# Patient Record
Sex: Male | Born: 1985 | Race: Black or African American | Hispanic: No | Marital: Single | Smoking: Current some day smoker
Health system: Southern US, Community
[De-identification: ages and names within clinical notes are randomized; demographics above are authoritative.]

## PROBLEM LIST (undated history)

## (undated) DIAGNOSIS — Z21 Asymptomatic human immunodeficiency virus [HIV] infection status: Secondary | ICD-10-CM

## (undated) DIAGNOSIS — B20 Human immunodeficiency virus [HIV] disease: Secondary | ICD-10-CM

---

## 2011-10-27 ENCOUNTER — Encounter (HOSPITAL_COMMUNITY): Payer: Self-pay | Admitting: Emergency Medicine

## 2011-10-27 ENCOUNTER — Emergency Department (HOSPITAL_COMMUNITY): Payer: Self-pay

## 2011-10-27 ENCOUNTER — Emergency Department (HOSPITAL_COMMUNITY)
Admission: EM | Admit: 2011-10-27 | Discharge: 2011-10-27 | Disposition: A | Discharge status: Home / Self Care | Payer: Self-pay | Attending: Emergency Medicine | Admitting: Emergency Medicine

## 2011-10-27 DIAGNOSIS — R05 Cough: Secondary | ICD-10-CM | POA: Insufficient documentation

## 2011-10-27 DIAGNOSIS — R059 Cough, unspecified: Secondary | ICD-10-CM | POA: Insufficient documentation

## 2011-10-27 DIAGNOSIS — R0789 Other chest pain: Secondary | ICD-10-CM | POA: Insufficient documentation

## 2011-10-27 DIAGNOSIS — J209 Acute bronchitis, unspecified: Secondary | ICD-10-CM

## 2011-10-27 DIAGNOSIS — R509 Fever, unspecified: Secondary | ICD-10-CM | POA: Insufficient documentation

## 2011-10-27 MED ORDER — BENZONATATE 100 MG PO CAPS: 200.0000 mg | ORAL_CAPSULE | Freq: Two times a day (BID) | ORAL | Status: AC | PRN

## 2011-10-27 MED ORDER — GUAIFENESIN 100 MG/5ML PO LIQD: 100.0000 mg | ORAL | Status: AC | PRN

## 2011-10-27 MED ORDER — OXYCODONE-ACETAMINOPHEN 5-325 MG PO TABS
2.0000 | ORAL_TABLET | Freq: Once | ORAL | Status: AC
  Administered 2011-10-27: 2 via ORAL
  Filled 2011-10-27: qty 2

## 2011-10-27 MED ORDER — IBUPROFEN 800 MG PO TABS: 800.0000 mg | ORAL_TABLET | Freq: Three times a day (TID) | ORAL | Status: AC

## 2011-10-27 MED ORDER — ALBUTEROL SULFATE HFA 108 (90 BASE) MCG/ACT IN AERS: 1.0000 | INHALATION_SPRAY | Freq: Four times a day (QID) | RESPIRATORY_TRACT | Status: DC | PRN

## 2011-10-27 NOTE — ED Notes (Signed)
ZOX:[id number]<AV> Expected date:10/27/11<BR> Expected time: 4:13 AM<BR> Means of arrival:Ambulance<BR> Comments:<BR> RM 23: Fever, chest pain w/ cough

## 2011-10-27 NOTE — ED Notes (Signed)
Patient transported to X-ray 

## 2011-10-27 NOTE — ED Notes (Signed)
Pt alert, arives from home, c/o cough, fever, chest pain, onset was this evening, URI s/s two weeks ago, resp even unlabored, skin pwd

## 2011-10-27 NOTE — ED Provider Notes (Signed)
Medical screening examination/treatment/procedure(s) were performed by non-physician practitioner and as supervising physician I was immediately available for consultation/collaboration.  Cheri Guppy, MD 10/27/11 [phone]

## 2011-10-27 NOTE — ED Provider Notes (Signed)
History     CSN: [id number]  Arrival date & time 10/27/11  4098   First MD Initiated Contact with Patient 10/27/11 0550      Chief Complaint  Patient presents with  . Cough  . Fever    (Consider location/radiation/quality/duration/timing/severity/associated sxs/prior treatment) HPI Comments: Joseph Mcfarland 26 y.o. male   The chief complaint is: Patient presents with:   Cough   Fever   The patient has medical history significant for:   History reviewed. No pertinent past medical history.  Patient presents with a three week history of non-productive cough, subjective fever, chills, night sweats, rhinorrhea, and pleuritic pain rated 5-6/10. He has tried OTC tylenol and mucinex without improvement of symptoms. Denies SOB. Denies sore throat. Reports some nausea but denies vomiting or diarrhea. Sick contacts include a cousin who is also his roommate.      Patient is a 26 y.o. male presenting with fever. The history is provided by the patient.  Fever Primary symptoms of the febrile illness include fever, cough and nausea. Primary symptoms do not include shortness of breath, abdominal pain, vomiting or diarrhea.    History reviewed. No pertinent past medical history.  History reviewed. No pertinent past surgical history.  No family history on file.  History  Substance Use Topics  . Smoking status: Not on file  . Smokeless tobacco: Not on file  . Alcohol Use: Not on file      Review of Systems  Constitutional: Positive for fever, chills and diaphoresis.  HENT: Positive for rhinorrhea. Negative for congestion and sore throat.   Respiratory: Positive for cough and chest tightness. Negative for shortness of breath.   Gastrointestinal: Positive for nausea. Negative for vomiting, abdominal pain and diarrhea.  All other systems reviewed and are negative.    Allergies  Review of patient's allergies indicates no known allergies.  Home Medications  No current  outpatient prescriptions on file.  BP 115/78  Pulse 90  Temp 98 F (36.7 C) (Oral)  Resp 18  SpO2 98%  Physical Exam  Nursing note and vitals reviewed. Constitutional: He appears well-developed and well-nourished. He appears distressed.  HENT:  Head: Normocephalic and atraumatic.  Mouth/Throat: Oropharynx is clear and moist. No oropharyngeal exudate.  Eyes: Conjunctivae and EOM are normal. No scleral icterus.  Neck: Normal range of motion. Neck supple.  Cardiovascular: Normal rate, regular rhythm and normal heart sounds.   Pulmonary/Chest: Effort normal and breath sounds normal.  Abdominal: Soft. Bowel sounds are normal. There is no tenderness.  Lymphadenopathy:    He has no cervical adenopathy.  Neurological: He is alert.  Skin: Skin is warm and dry.    ED Course  Procedures (including critical care time)  Labs Reviewed - No data to display No results found.   1. Acute bronchitis       MDM  Patient presented with 3 weeks of cough, subjective fevers, chills, nigh sweats, and pleuritic CP 5-6/10. Patient given pain medication in ED. CXR: remarkable for bronchitis. Patient discharged on inhaler, ibuprofen, and cough suppressants. Return precautions given verbally and in discharge instructions. No red flags for pneumonia, ARDS, or pneumothorax.        Pixie Casino, PA-C 10/27/11 [phone]

## 2012-03-08 ENCOUNTER — Telehealth: Payer: Self-pay

## 2012-03-08 NOTE — Telephone Encounter (Signed)
Left message for patient to call for Intake Appointment.  Referral from [facility].  Tomasita Morrow, RN

## 2012-03-23 ENCOUNTER — Ambulatory Visit: Payer: Self-pay

## 2012-04-02 ENCOUNTER — Telehealth: Payer: Self-pay

## 2012-04-02 NOTE — Telephone Encounter (Signed)
Joseph Mcfarland , [facility] Counselor was able to contact patient. He is ready to schedule appointment but needs it before 10:00 am.  Appointment info left on his voicemail per patient request.   Pt advised to call the office if he is not able to keep appointment.   [name], RN

## 2012-04-20 ENCOUNTER — Ambulatory Visit: Payer: Self-pay

## 2012-07-22 NOTE — Progress Notes (Signed)
Pt has been referred to [facility] due to missed intake appointments and no return calls.   [name], RN

## 2013-03-25 ENCOUNTER — Emergency Department (HOSPITAL_COMMUNITY)
Admission: EM | Admit: 2013-03-25 | Discharge: 2013-03-25 | Disposition: A | Discharge status: Home / Self Care | Payer: Self-pay | Attending: Emergency Medicine | Admitting: Emergency Medicine

## 2013-03-25 ENCOUNTER — Emergency Department (HOSPITAL_COMMUNITY): Payer: Self-pay

## 2013-03-25 ENCOUNTER — Encounter (HOSPITAL_COMMUNITY): Payer: Self-pay | Admitting: Emergency Medicine

## 2013-03-25 DIAGNOSIS — R519 Headache, unspecified: Secondary | ICD-10-CM

## 2013-03-25 DIAGNOSIS — Z21 Asymptomatic human immunodeficiency virus [HIV] infection status: Secondary | ICD-10-CM | POA: Insufficient documentation

## 2013-03-25 DIAGNOSIS — R51 Headache: Secondary | ICD-10-CM | POA: Insufficient documentation

## 2013-03-25 DIAGNOSIS — R000 Tachycardia, unspecified: Secondary | ICD-10-CM | POA: Insufficient documentation

## 2013-03-25 DIAGNOSIS — F17200 Nicotine dependence, unspecified, uncomplicated: Secondary | ICD-10-CM | POA: Insufficient documentation

## 2013-03-25 HISTORY — DX: Human immunodeficiency virus (HIV) disease: B20

## 2013-03-25 HISTORY — DX: Asymptomatic human immunodeficiency virus (hiv) infection status: Z21

## 2013-03-25 LAB — T-HELPER CELLS (CD4) COUNT (NOT AT ARMC)
CD4 T CELL ABS: 30 /uL — AB (ref 400–2700)
CD4 T CELL HELPER: 2 % — AB (ref 33–55)

## 2013-03-25 LAB — CBC WITH DIFFERENTIAL/PLATELET
BASOS ABS: 0 10*3/uL (ref 0.0–0.1)
Basophils Relative: 0 % (ref 0–1)
EOS ABS: 0.2 10*3/uL (ref 0.0–0.7)
EOS PCT: 3 % (ref 0–5)
HCT: 37.5 % — ABNORMAL LOW (ref 39.0–52.0)
Hemoglobin: 13.1 g/dL (ref 13.0–17.0)
LYMPHS ABS: 1.5 10*3/uL (ref 0.7–4.0)
Lymphocytes Relative: 24 % (ref 12–46)
MCH: 29.8 pg (ref 26.0–34.0)
MCHC: 34.9 g/dL (ref 30.0–36.0)
MCV: 85.4 fL (ref 78.0–100.0)
Monocytes Absolute: 0.7 10*3/uL (ref 0.1–1.0)
Monocytes Relative: 11 % (ref 3–12)
NEUTROS PCT: 62 % (ref 43–77)
Neutro Abs: 3.8 10*3/uL (ref 1.7–7.7)
PLATELETS: 297 10*3/uL (ref 150–400)
RBC: 4.39 MIL/uL (ref 4.22–5.81)
RDW: 12.6 % (ref 11.5–15.5)
WBC: 6.2 10*3/uL (ref 4.0–10.5)

## 2013-03-25 MED ORDER — KETOROLAC TROMETHAMINE 30 MG/ML IJ SOLN
30.0000 mg | Freq: Once | INTRAMUSCULAR | Status: AC
  Administered 2013-03-25: 30 mg via INTRAVENOUS
  Filled 2013-03-25: qty 1

## 2013-03-25 MED ORDER — METOCLOPRAMIDE HCL 10 MG PO TABS: 10.0000 mg | ORAL_TABLET | Freq: Three times a day (TID) | ORAL | Status: DC | PRN

## 2013-03-25 MED ORDER — SODIUM CHLORIDE 0.9 % IV BOLUS (SEPSIS)
1000.0000 mL | Freq: Once | INTRAVENOUS | Status: AC
  Administered 2013-03-25: 1000 mL via INTRAVENOUS

## 2013-03-25 MED ORDER — METOCLOPRAMIDE HCL 5 MG/ML IJ SOLN
10.0000 mg | Freq: Once | INTRAMUSCULAR | Status: AC
  Administered 2013-03-25: 10 mg via INTRAVENOUS
  Filled 2013-03-25: qty 2

## 2013-03-25 MED ORDER — NAPROXEN 500 MG PO TABS: 500.0000 mg | ORAL_TABLET | Freq: Two times a day (BID) | ORAL | Status: DC
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
## 2013-03-25 NOTE — Discharge Instructions (Signed)
Please call your doctor for a followup appointment within 24-48 hours. When you talk to your doctor please let them know that you were seen in the emergency department and have them acquire all of your records so that they can discuss the findings with you and formulate a treatment plan to fully care for your new and ongoing problems.  I would encourage you to follow up closely with the infectious disease center here at [facility] for further counseling and treatment of your HIV disease

## 2013-03-25 NOTE — ED Notes (Signed)
Patient reports experiencing migraine symptoms. Denies n/v, but has some photophobia. Patient was recently diagnosed with HIV last month and states his migraines have gotten worse since being diagnosed. Patient also reports having neck tenderness. Patient is a/o x4, NAD.

## 2013-04-12 ENCOUNTER — Ambulatory Visit: Payer: Self-pay

## 2013-04-14 ENCOUNTER — Emergency Department (HOSPITAL_COMMUNITY)
Admission: EM | Admit: 2013-04-14 | Discharge: 2013-04-14 | Disposition: A | Discharge status: Home / Self Care | Payer: Self-pay | Attending: Emergency Medicine | Admitting: Emergency Medicine

## 2013-04-14 ENCOUNTER — Encounter (HOSPITAL_COMMUNITY): Payer: Self-pay | Admitting: Emergency Medicine

## 2013-04-14 DIAGNOSIS — R21 Rash and other nonspecific skin eruption: Secondary | ICD-10-CM | POA: Insufficient documentation

## 2013-04-14 DIAGNOSIS — F17200 Nicotine dependence, unspecified, uncomplicated: Secondary | ICD-10-CM | POA: Insufficient documentation

## 2013-04-14 DIAGNOSIS — Z21 Asymptomatic human immunodeficiency virus [HIV] infection status: Secondary | ICD-10-CM | POA: Insufficient documentation

## 2013-04-14 MED ORDER — DIPHENHYDRAMINE HCL 25 MG PO CAPS
50.0000 mg | ORAL_CAPSULE | Freq: Once | ORAL | Status: AC
  Administered 2013-04-14: 50 mg via ORAL
  Filled 2013-04-14: qty 2

## 2013-04-14 MED ORDER — DIPHENHYDRAMINE HCL 25 MG PO TABS: 25.0000 mg | ORAL_TABLET | Freq: Four times a day (QID) | ORAL | Status: AC | PRN

## 2013-04-14 MED ORDER — DEXAMETHASONE SODIUM PHOSPHATE 10 MG/ML IJ SOLN
10.0000 mg | Freq: Once | INTRAMUSCULAR | Status: AC
  Administered 2013-04-14: 10 mg via INTRAMUSCULAR
  Filled 2013-04-14: qty 1

## 2013-04-14 NOTE — ED Notes (Signed)
Pt dc to home. Pt sts understanding to dc instructions. Pt ambulatory to exit without difficulty.  Pt denies need for w/c.  

## 2013-04-14 NOTE — ED Provider Notes (Signed)
CSN: [id number]     Arrival date & time 04/14/13  1523 History  This chart was scribed for non-physician practitioner Felipa Furnace working with Gavin Pound. Oletta Lamas, MD by Joaquin Music, ED Scribe. This patient was seen in room TR09C/TR09C and the patient's care was started at 4:21 PM .   Chief Complaint  Patient presents with  . Rash   The history is provided by the patient. No language interpreter was used.   HPI Comments: Joseph Mcfarland is a 28 y.o. male who presents to the Emergency Department complaining of entire body rash with associated itching that began 1 hour ago. Pt denies coming in contact with anything recently. He states he took a shower and went to a local grocery store and reports having the rash begin suddenly. Pt denies having pain. Pt denies hx of prior rash.  Pt denies taking any new medications recently, eating or drinking any new foods today, using new clothes, and change of detergents. Pt denies taking Benadryl PTA. Pt denies fever, SOB, chest tightness, wheezing, and facial swelling.   Past Medical History  Diagnosis Date  . HIV (human immunodeficiency virus infection)    History reviewed. No pertinent past surgical history. History reviewed. No pertinent family history. History  Substance Use Topics  . Smoking status: Current Some Day Smoker    Types: Cigarettes  . Smokeless tobacco: Never Used  . Alcohol Use: Yes     Comment: Socially    Review of Systems  Constitutional: Negative for fever.  HENT: Negative for facial swelling.   Respiratory: Negative for chest tightness, shortness of breath and wheezing.   Skin: Positive for rash.   Allergies  Review of patient's allergies indicates no known allergies.  Home Medications  No current outpatient prescriptions on file.  BP 133/90  Pulse 93  Temp(Src) 97.4 F (36.3 C) (Oral)  Resp 18  SpO2 97%  Physical Exam  Nursing note and vitals reviewed. Constitutional: He is oriented to  person, place, and time. He appears well-developed and well-nourished. No distress.  HENT:  Head: Normocephalic and atraumatic.  No angio edema. No K[place] spots.  Eyes: EOM are normal.  Neck: Neck supple. No tracheal deviation present.  Cardiovascular: Normal rate.   Pulmonary/Chest: Effort normal. No respiratory distress.  No SOB, no stridor, normal respirations. No respitory distress.   Musculoskeletal: Normal range of motion.  Neurological: He is alert and oriented to person, place, and time.  Skin: Skin is warm and dry. Rash noted.  Diffuse macular papular rash, no evidence of cellulitis or abscess.  Psychiatric: He has a normal mood and affect. His behavior is normal.    ED Course  Procedures DIAGNOSTIC STUDIES: Oxygen Saturation is 97% on RA, normal by my interpretation.    COORDINATION OF CARE: 4:23 PM-Discussed treatment plan which includes advised pt to F/U with dermatologist if symptoms do not improve. Will administer Benadryl while in ED and shot of Decadron. Will observe pt for 30 mins in ED. Advised pt to return to the ED if high fever, airway constricts, or any worsening symptoms present. Pt agreed to plan.   4:55 PM-Evaluated breathing and rash condition of patient. Rash has improved. Will continue to monitor patient for 10 more mins. Pt agreed to tx.  5:12 PM- Re-evaluated rash and breathing conditions of patient. Rash has improved. Pt no longer complains of itching. Will discharge pt with Benadryl. Pt agreed to plan.  Labs Review Labs Reviewed - No data to display Imaging Review No results  found.  EKG Interpretation   None      MDM   Final diagnoses:  Rash    Patient with rash. It started suddenly today. He does not recall coming in contact with anything, or eating anything. Denies any new detergents or soaps. Patient was recently diagnosed with HIV, he is not receiving treatment yet. He is still working on establishing care. Nothing about the rash seems  infectious. He came on suddenly about an hour or 2 ago, and is itchy. It is improved dramatically with Benadryl. He states that he feels much better. No fevers, no neck stiffness. Rashes on the body, but spares the palms and soles.  Will discharge patient home with infectious disease followup. Continue Benadryl. Return precautions have been given. Patient understands and agrees with the plan. He is stable and ready for discharge. I discussed this patient with Dr. Oletta LamasGhim, who agrees with the plan.  I personally performed the services described in this documentation, which was scribed in my presence. The recorded information has been reviewed and is accurate.     Roxy Horsemanobert Audine Mangione, PA-C 04/14/13 1717

## 2013-04-14 NOTE — ED Notes (Signed)
Reports onset of rash and itching to entire body x 1 hour, denies new meds or products. Has not taken any benadryl pta. Airway intact.

## 2013-04-14 NOTE — Discharge Instructions (Signed)

## 2013-04-19 ENCOUNTER — Ambulatory Visit (INDEPENDENT_AMBULATORY_CARE_PROVIDER_SITE_OTHER): Payer: Self-pay

## 2013-04-19 DIAGNOSIS — Z113 Encounter for screening for infections with a predominantly sexual mode of transmission: Secondary | ICD-10-CM

## 2013-04-19 DIAGNOSIS — B20 Human immunodeficiency virus [HIV] disease: Secondary | ICD-10-CM

## 2013-04-19 DIAGNOSIS — Z23 Encounter for immunization: Secondary | ICD-10-CM

## 2013-04-19 DIAGNOSIS — Z79899 Other long term (current) drug therapy: Secondary | ICD-10-CM

## 2013-04-19 LAB — CBC WITH DIFFERENTIAL/PLATELET
BASOS ABS: 0 10*3/uL (ref 0.0–0.1)
BASOS PCT: 0 % (ref 0–1)
EOS ABS: 0.2 10*3/uL (ref 0.0–0.7)
Eosinophils Relative: 8 % — ABNORMAL HIGH (ref 0–5)
HCT: 40.4 % (ref 39.0–52.0)
HEMOGLOBIN: 13.8 g/dL (ref 13.0–17.0)
Lymphocytes Relative: 39 % (ref 12–46)
Lymphs Abs: 0.8 10*3/uL (ref 0.7–4.0)
MCH: 28.9 pg (ref 26.0–34.0)
MCHC: 34.2 g/dL (ref 30.0–36.0)
MCV: 84.5 fL (ref 78.0–100.0)
MONOS PCT: 10 % (ref 3–12)
Monocytes Absolute: 0.2 10*3/uL (ref 0.1–1.0)
NEUTROS ABS: 0.9 10*3/uL — AB (ref 1.7–7.7)
NEUTROS PCT: 43 % (ref 43–77)
PLATELETS: 409 10*3/uL — AB (ref 150–400)
RBC: 4.78 MIL/uL (ref 4.22–5.81)
RDW: 13.8 % (ref 11.5–15.5)
WBC: 2 10*3/uL — ABNORMAL LOW (ref 4.0–10.5)

## 2013-04-19 LAB — COMPLETE METABOLIC PANEL WITH GFR
ALBUMIN: 4.2 g/dL (ref 3.5–5.2)
ALK PHOS: 46 U/L (ref 39–117)
ALT: 12 U/L (ref 0–53)
AST: 24 U/L (ref 0–37)
BILIRUBIN TOTAL: 0.3 mg/dL (ref 0.2–1.2)
BUN: 7 mg/dL (ref 6–23)
CO2: 29 mEq/L (ref 19–32)
Calcium: 9.6 mg/dL (ref 8.4–10.5)
Chloride: 102 mEq/L (ref 96–112)
Creat: 0.92 mg/dL (ref 0.50–1.35)
GFR, Est African American: >89 mL/min
GLUCOSE: 83 mg/dL (ref 70–99)
POTASSIUM: 4.4 meq/L (ref 3.5–5.3)
SODIUM: 139 meq/L (ref 135–145)
TOTAL PROTEIN: 7.3 g/dL (ref 6.0–8.3)

## 2013-04-19 LAB — LIPID PANEL
CHOL/HDL RATIO: 3.1 ratio
Cholesterol: 122 mg/dL (ref 0–200)
HDL: 40 mg/dL (ref >39)
LDL Cholesterol: 57 mg/dL (ref 0–99)
Triglycerides: 127 mg/dL (ref <150)
VLDL: 25 mg/dL (ref 0–40)

## 2013-04-19 LAB — HEPATITIS C ANTIBODY: HCV AB: NEGATIVE

## 2013-04-19 LAB — RPR

## 2013-04-19 LAB — HEPATITIS B CORE ANTIBODY, TOTAL: HEP B C TOTAL AB: NONREACTIVE

## 2013-04-19 LAB — HEPATITIS B SURFACE ANTIBODY,QUALITATIVE: HEP B S AB: POSITIVE — AB

## 2013-04-19 LAB — HEPATITIS A ANTIBODY, TOTAL: Hep A Total Ab: NONREACTIVE

## 2013-04-19 LAB — HEPATITIS B SURFACE ANTIGEN: Hepatitis B Surface Ag: NEGATIVE

## 2013-04-20 LAB — URINALYSIS
BILIRUBIN URINE: NEGATIVE
GLUCOSE, UA: NEGATIVE mg/dL
HGB URINE DIPSTICK: NEGATIVE
Ketones, ur: NEGATIVE mg/dL
LEUKOCYTES UA: NEGATIVE
Nitrite: NEGATIVE
PROTEIN: NEGATIVE mg/dL
Specific Gravity, Urine: 1.016 (ref 1.005–1.030)
UROBILINOGEN UA: 1 mg/dL (ref 0.0–1.0)
pH: 8 (ref 5.0–8.0)

## 2013-04-20 LAB — T-HELPER CELL (CD4) - (RCID CLINIC ONLY)
CD4 % Helper T Cell: 2 % — ABNORMAL LOW (ref 33–55)
CD4 T Cell Abs: 20 /uL — ABNORMAL LOW (ref 400–2700)

## 2013-04-20 LAB — HIV-1 RNA ULTRAQUANT REFLEX TO GENTYP+
HIV 1 RNA QUANT: 224934 {copies}/mL — AB (ref <20)
HIV-1 RNA QUANT, LOG: 5.35 {Log} — AB (ref <1.30)

## 2013-04-20 LAB — URINE CYTOLOGY ANCILLARY ONLY
CHLAMYDIA, DNA PROBE: NEGATIVE
Neisseria Gonorrhea: NEGATIVE

## 2013-04-22 NOTE — ED Provider Notes (Signed)
Medical screening examination/treatment/procedure(s) were performed by non-physician practitioner and as supervising physician I was immediately available for consultation/collaboration.  EKG Interpretation  None    Dayanis Bergquist Y. Seung Nidiffer, MD 04/22/13 1033 

## 2013-04-28 NOTE — Progress Notes (Signed)
[facility] December [facility], patient states he was not ready to deal with his diagnosis. He was contacted by the state health department and an appointment was made at that time. Patient states that he has the support of his Mother and siblings, he also has a friend that is positive. He admits to having thrush and a skin rash both being treated with fluconazole and clindamycin that were given to him at Urgent Care. He also states that he has noticed weight loss, and night sweats over the past few months. Vaccines given and updated. No records received.

## 2013-04-30 LAB — HIV-1 GENOTYPR PLUS

## 2013-05-03 ENCOUNTER — Ambulatory Visit: Payer: Self-pay | Admitting: Infectious Disease

## 2013-05-04 ENCOUNTER — Encounter: Payer: Self-pay | Admitting: Infectious Disease

## 2013-05-04 ENCOUNTER — Ambulatory Visit (INDEPENDENT_AMBULATORY_CARE_PROVIDER_SITE_OTHER): Payer: Self-pay | Admitting: Infectious Disease

## 2013-05-04 ENCOUNTER — Encounter: Payer: Self-pay | Admitting: *Deleted

## 2013-05-04 VITALS — BP 128/88 | HR 93 | Temp 98.8°F | Ht 68.0 in | Wt 145.0 lb

## 2013-05-04 DIAGNOSIS — B20 Human immunodeficiency virus [HIV] disease: Secondary | ICD-10-CM | POA: Insufficient documentation

## 2013-05-04 DIAGNOSIS — B370 Candidal stomatitis: Secondary | ICD-10-CM

## 2013-05-04 DIAGNOSIS — F17200 Nicotine dependence, unspecified, uncomplicated: Secondary | ICD-10-CM

## 2013-05-04 MED ORDER — SULFAMETHOXAZOLE-TMP DS 800-160 MG PO TABS: 1.0000 | ORAL_TABLET | Freq: Every day | ORAL | Status: DC

## 2013-05-04 MED ORDER — FLUCONAZOLE 100 MG PO TABS: 100.0000 mg | ORAL_TABLET | Freq: Every day | ORAL | Status: DC

## 2013-05-04 MED ORDER — AZITHROMYCIN 600 MG PO TABS: 1200.0000 mg | ORAL_TABLET | ORAL | Status: DC

## 2013-05-04 MED ORDER — AZITHROMYCIN 600 MG PO TABS: 1200.0000 mg | ORAL_TABLET | ORAL | Status: AC

## 2013-05-04 MED ORDER — ELVITEG-COBIC-EMTRICIT-TENOFDF 150-150-200-300 MG PO TABS: 1.0000 | ORAL_TABLET | Freq: Every day | ORAL | Status: DC

## 2013-05-04 NOTE — Progress Notes (Signed)
   Subjective:    Patient ID: Joseph Mcfarland, male    DOB: [birth year], 28 y.o.   MRN: [id number]  HPI  28 year old with HIV/AIDS, apparently diagnosed in 2013 but he did not seek treatment after having been tested in [place]. He moved back to [place]. His intake labs show a CD4 of 20, and a VL >200K  He has apparent wildtype virus (though our genotype dose not check HIV integrase mutations).  AFter extensive discussions we decided to start STRIBILD once his ADAP is processed but in the interim I gave him a bottle of Atripla QHS    Review of Systems  Constitutional: Negative for fever, chills, diaphoresis, activity change, appetite change, fatigue and unexpected weight change.  HENT: Negative for congestion, rhinorrhea, sinus pressure, sneezing, sore throat and trouble swallowing.   Eyes: Negative for photophobia and visual disturbance.  Respiratory: Negative for cough, chest tightness, shortness of breath, wheezing and stridor.   Cardiovascular: Negative for chest pain, palpitations and leg swelling.  Gastrointestinal: Negative for nausea, vomiting, abdominal pain, diarrhea, constipation, blood in stool, abdominal distention and anal bleeding.  Genitourinary: Negative for dysuria, hematuria, flank pain and difficulty urinating.  Musculoskeletal: Negative for arthralgias, back pain, gait problem, joint swelling and myalgias.  Skin: Negative for color change, pallor, rash and wound.  Neurological: Negative for dizziness, tremors, weakness and light-headedness.  Hematological: Negative for adenopathy. Does not bruise/bleed easily.  Psychiatric/Behavioral: Negative for behavioral problems, confusion, sleep disturbance, dysphoric mood, decreased concentration and agitation.       Objective:   Physical Exam  Constitutional: He is oriented to person, place, and time. He appears well-developed and well-nourished. No distress.  HENT:  Head: Normocephalic and atraumatic.  Mouth/Throat: Oropharynx is  clear and moist. No oropharyngeal exudate.    Eyes: Conjunctivae and EOM are normal. No scleral icterus.  Neck: Normal range of motion. Neck supple. No JVD present.  Cardiovascular: Normal rate, regular rhythm and normal heart sounds.  Exam reveals no gallop and no friction rub.   No murmur heard. Pulmonary/Chest: Effort normal and breath sounds normal. No respiratory distress. He has no wheezes. He has no rales. He exhibits no tenderness.  Abdominal: He exhibits no distension and no mass. There is no tenderness. There is no rebound and no guarding.  Musculoskeletal: He exhibits no edema and no tenderness.  Lymphadenopathy:    He has no cervical adenopathy.  Neurological: He is alert and oriented to person, place, and time. He has normal reflexes. He exhibits normal muscle tone. Coordination normal.  Skin: Skin is warm and dry. He is not diaphoretic. No erythema. No pallor.  Psychiatric: He has a normal mood and affect. His behavior is normal. Judgment and thought content normal.          Assessment & Plan:   HIV/AIDS: start ATripla with sample bottle UNTIL his ADAP kicks in and then switch to [facility]. I will also check his HLA B701 at next lab test and we can consider change to [facility] or COMPLERA if tolerability a problem.  Start Bactrim daily  Start Azithromycin 1237m weekly  I spent greater than 60 minutes with the patient including greater than 50% of time in face to face counsel of the patient and in coordination of their care.  Thrush: fluconazole 1068mx 14 days  Smoking: counselled on smoking cessation

## 2013-05-04 NOTE — Patient Instructions (Signed)
UNTIL we get ADAP approval  Take Atripla every night at bedtime on empty stomach  Take bactrim DS white pill to prevent fungal (PCP) PNeumonia  Take TWO tablets every week = azithromycin 1200 mg to prevent MAC infection  I also wrote for some fluconazole 100mg  daily for 2 weeks to treat thrush  WHEN ADAP kicks in you can replace the Atripla with STRIBILD which needs tob e TAKEN WITH FOOD

## 2013-05-04 NOTE — Progress Notes (Signed)
  [facility] - Pharmacist    HPI: Joseph Mcfarland is a 28 y.o. male who presents today to start therapy for his HIV infection which was diagnosed in December 2013. His immune system is significantly weakened with a CD4 count of 20, and his HIV viral load is 224,934.  Allergies: No Known Allergies  Vitals: Temp: 98.8 F (37.1 C) (03/11 1516) Temp src: Oral (03/11 1516) BP: 128/88 mmHg (03/11 1516) Pulse Rate: 93 (03/11 1516)  Past Medical History: Past Medical History  Diagnosis Date  . HIV (human immunodeficiency virus infection)     Social History: History   Social History  . Marital Status: Single    Spouse Name: N/A    Number of Children: N/A  . Years of Education: N/A   Social History Main Topics  . Smoking status: Current Some Day Smoker -- 0.50 packs/day    Types: Cigarettes  . Smokeless tobacco: Never Used  . Alcohol Use: Yes     Comment: Socially  . Drug Use: No  . Sexual Activity: Not Currently   Other Topics Concern  . None   Social History Narrative  . None   Current Regimen: No current regimen  Labs: HIV 1 RNA Quant (copies/mL)  Date Value  04/19/2013 [id number]*     CD4 T Cell Abs (/uL)  Date Value  04/19/2013 20*  03/25/2013 30*     Hep B S Ab (no units)  Date Value  04/19/2013 POS*     Hepatitis B Surface Ag (no units)  Date Value  04/19/2013 NEGATIVE      HCV Ab (no units)  Date Value  04/19/2013 NEGATIVE    CrCl: Estimated Creatinine Clearance: 114.2 ml/min (by C-G formula based on Cr of 0.92).  Lipids:    Component Value Date/Time   CHOL 122 04/19/2013 1348   TRIG 127 04/19/2013 1348   HDL 40 04/19/2013 1348   CHOLHDL 3.1 04/19/2013 1348   VLDL 25 04/19/2013 1348   LDLCALC 57 04/19/2013 1348   Assessment: Mr. Lengacher is ready to start treatment today. We discussed at length the importance of taking his medications every day and around the same time every day and the risk for viral mutation if he  misses doses.   Potential therapy options were discussed, and patient said he prefers a once-daily medication. He stated he had no medication allergies, and is not on any other medications currently. His HLA-[id number] has not been tested yet. Based on our discussions, we determined he should start on Stribild. However, until his ADAP is processed, he will be started on Atripla. Potential side effects, including strange dreams, were discussed, and patient was advised to take the medication at night.   We also discussed the need for primary prophylaxis with Bactrim and azithromycin due to his very low CD4 count. He also had oral thrush present, so fluconazole therapy was discussed and will be started today.  Recommendations: - Start Atripla; f/u with d/c and start of Stribild when ADAP established - Start primarily prophylaxis with Bactrim and azithromycin - Start fluconazole for oral thrush. - F/u adherence, progress at next visit  [place]. Cordarrell Sane, PharmD Clinical Pharmacist-Resident Pager: [phone] Pharmacy: [phone] 05/04/2013 4:43 PM

## 2013-05-24 ENCOUNTER — Other Ambulatory Visit: Payer: Self-pay | Admitting: *Deleted

## 2013-05-24 DIAGNOSIS — B20 Human immunodeficiency virus [HIV] disease: Secondary | ICD-10-CM

## 2013-05-24 MED ORDER — ELVITEG-COBIC-EMTRICIT-TENOFDF 150-150-200-300 MG PO TABS: 1.0000 | ORAL_TABLET | Freq: Every day | ORAL | Status: AC

## 2013-05-24 MED ORDER — SULFAMETHOXAZOLE-TMP DS 800-160 MG PO TABS: 1.0000 | ORAL_TABLET | Freq: Every day | ORAL | Status: DC

## 2013-05-24 MED ORDER — FLUCONAZOLE 100 MG PO TABS: 100.0000 mg | ORAL_TABLET | Freq: Every day | ORAL | Status: DC

## 2013-06-29 ENCOUNTER — Other Ambulatory Visit: Payer: Self-pay

## 2013-07-06 ENCOUNTER — Ambulatory Visit: Payer: Self-pay | Admitting: Infectious Disease

## 2013-07-08 ENCOUNTER — Other Ambulatory Visit: Payer: Self-pay

## 2013-07-13 ENCOUNTER — Telehealth: Payer: Self-pay | Admitting: Infectious Disease

## 2013-07-13 NOTE — Telephone Encounter (Signed)
Can we make sure that Joseph Mcfarland got his STRIBILD via ADAP ? He is one of my detectables with followup in June. I gave him bottle of Atripla#30 prior to ADAP going thrru

## 2013-07-14 NOTE — Telephone Encounter (Signed)
Stribild, bactrim, azithromycin filled 5/4, pt did pick them up from pharmacy.

## 2013-07-14 NOTE — Telephone Encounter (Signed)
Fantastic!! Thanks Marcelino DusterMichelle!

## 2013-07-25 ENCOUNTER — Ambulatory Visit: Payer: Self-pay | Admitting: Infectious Disease

## 2013-08-18 ENCOUNTER — Other Ambulatory Visit: Payer: Self-pay | Admitting: Infectious Disease

## 2013-09-16 ENCOUNTER — Other Ambulatory Visit: Payer: Self-pay | Admitting: Infectious Disease

## 2013-09-21 ENCOUNTER — Ambulatory Visit: Payer: Self-pay | Admitting: Infectious Disease

## 2013-09-22 ENCOUNTER — Other Ambulatory Visit: Payer: Self-pay | Admitting: Infectious Disease

## 2013-10-18 ENCOUNTER — Other Ambulatory Visit: Payer: Self-pay | Admitting: Infectious Disease

## 2013-10-18 ENCOUNTER — Telehealth: Payer: Self-pay | Admitting: *Deleted

## 2013-10-18 NOTE — Telephone Encounter (Signed)
Patient requested diflucan refill.  Refill denied.  Patient no-showed multiple follow up appointments since new 042 intake in March, 2015.  Called patient, made lab/follow up appointments. Pt will come 8/27 for ADAP recertification and labs, will follow up with Dr. Daiva Eves 9/9.  He can discuss diflucan refills at that appointment. Pt verbalized understanding. Andree Coss, RN

## 2013-10-20 ENCOUNTER — Other Ambulatory Visit: Payer: Self-pay

## 2013-10-20 ENCOUNTER — Ambulatory Visit: Payer: Self-pay

## 2013-11-02 ENCOUNTER — Ambulatory Visit: Payer: Self-pay | Admitting: Infectious Disease

## 2014-05-04 ENCOUNTER — Other Ambulatory Visit: Payer: Self-pay | Admitting: *Deleted

## 2014-05-04 DIAGNOSIS — Z113 Encounter for screening for infections with a predominantly sexual mode of transmission: Secondary | ICD-10-CM

## 2015-04-22 IMAGING — CT CT HEAD W/O CM
2 series · 16 of 30 positions shown, 18 images · non-contrast
Comparison: None.

CLINICAL DATA: Headache

EXAM:
CT HEAD WITHOUT CONTRAST
TECHNIQUE: Contiguous axial images were obtained from the base of the skull
through the vertex without intravenous contrast.

[Series 2: head w/o · axial · non-contrast · 0.49mm/px · z∈[+78,+208]mm · 8 of 34 slices shown, 10 images]
[im 4/34  brain]
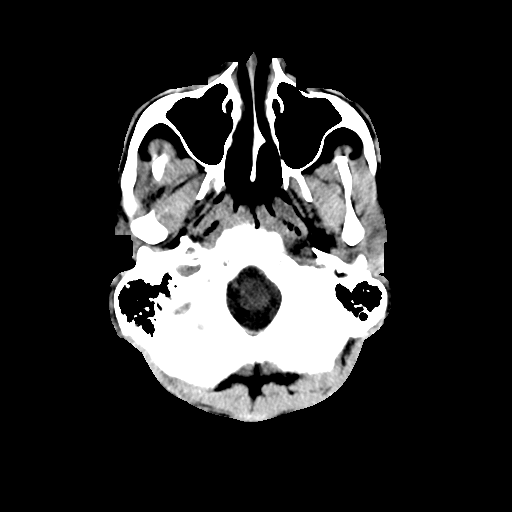
[im 4/34  bone]
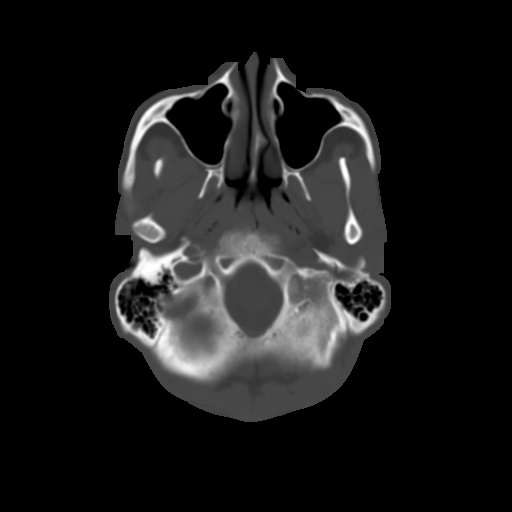
[im 8/34  brain]
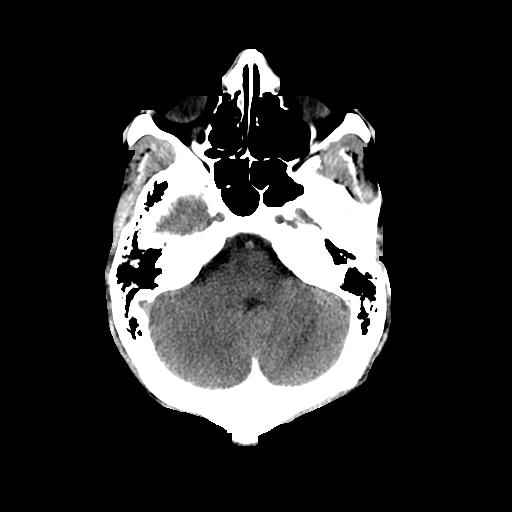
[im 12/34  brain]
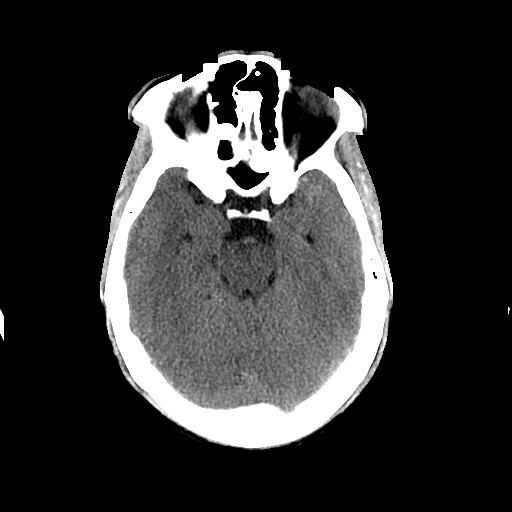
[im 15/34  brain]
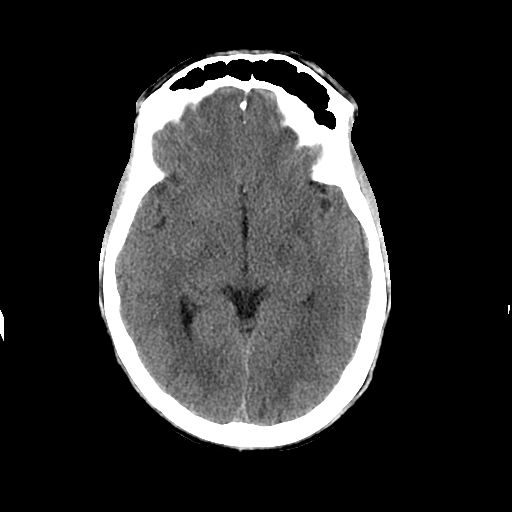
[im 19/34  brain]
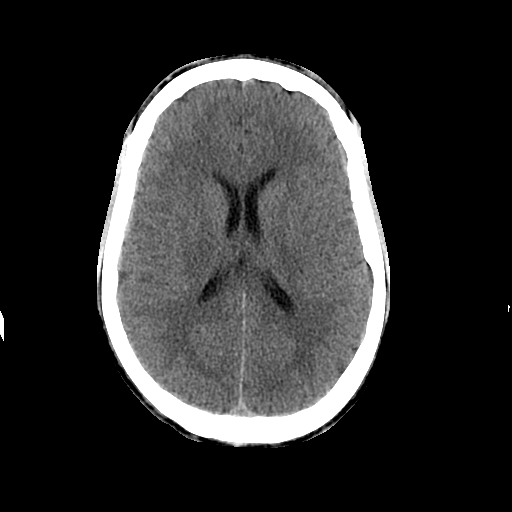
[im 19/34  bone]
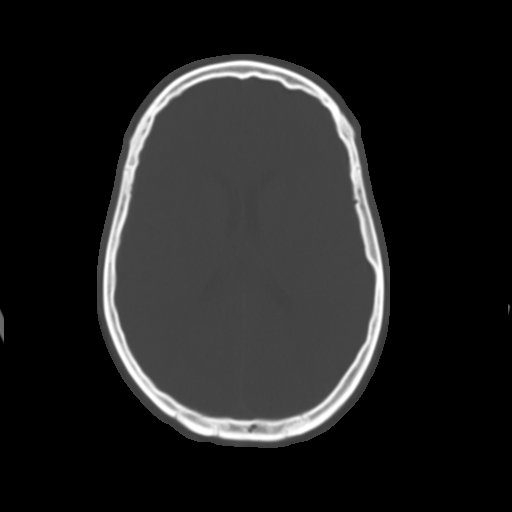
[im 23/34  brain]
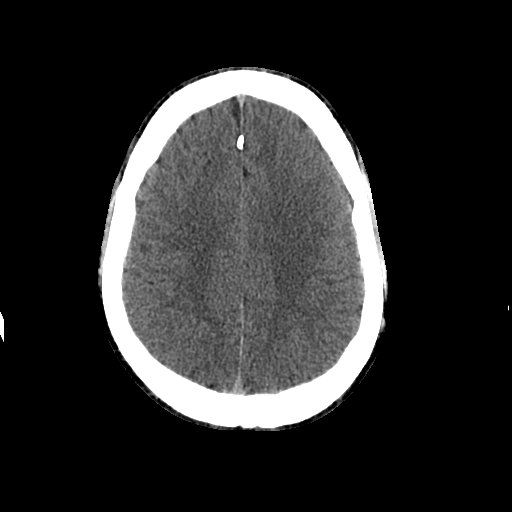
[im 26/34  brain]
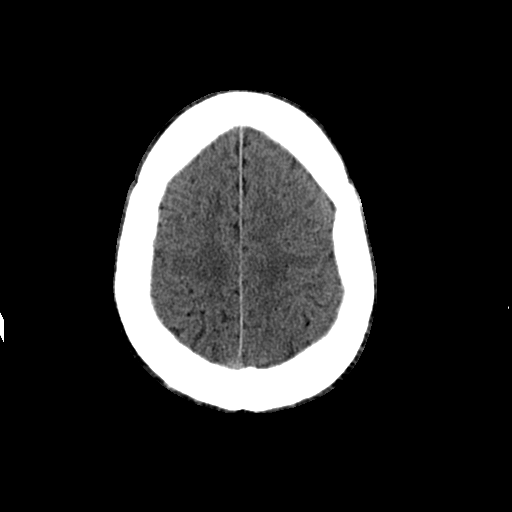
[im 30/34  brain]
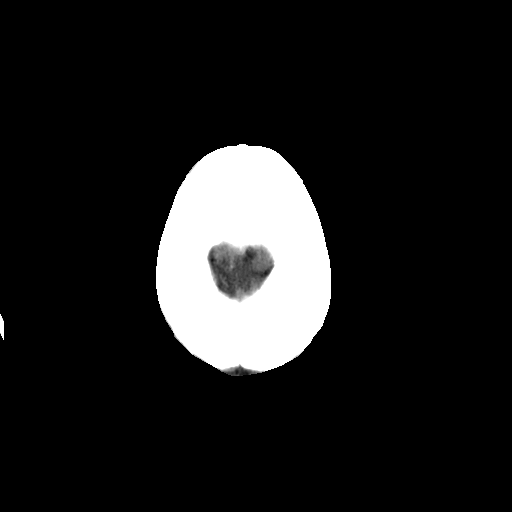

[Series 3: head w/o bone · axial · non-contrast · 0.49mm/px · z∈[+78,+210]mm · 8 of 67 slices shown]
[im 7/67  bone]
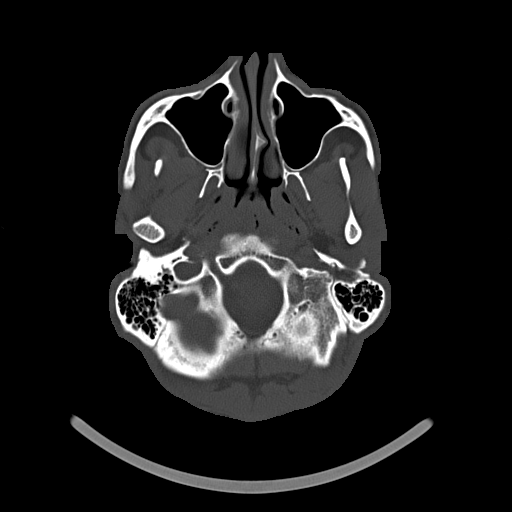
[im 14/67  bone]
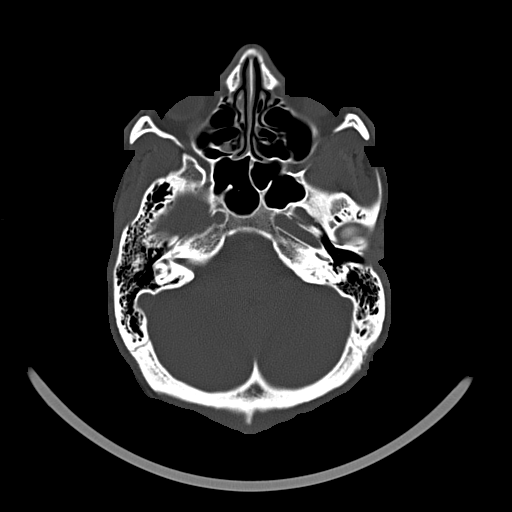
[im 21/67  bone]
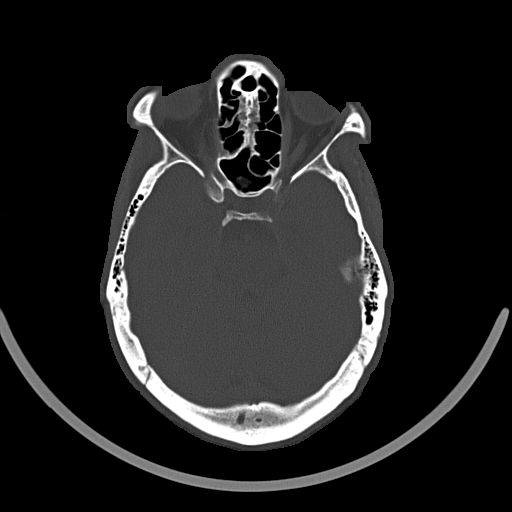
[im 28/67  bone]
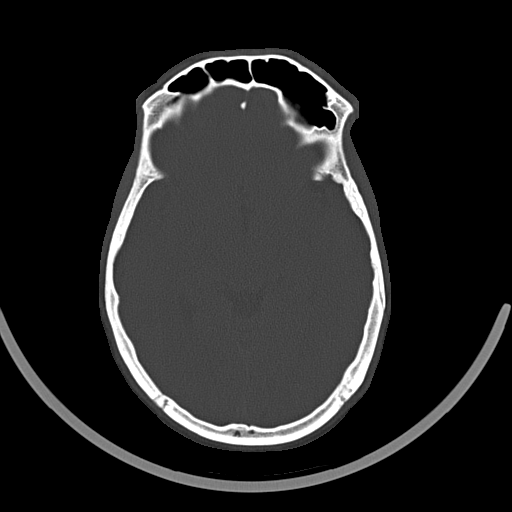
[im 39/67  bone]
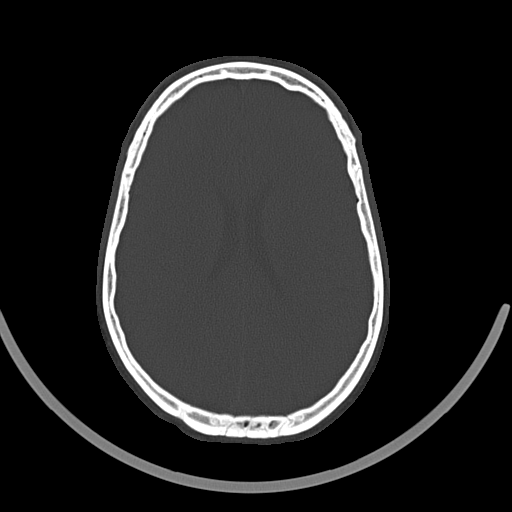
[im 46/67  bone]
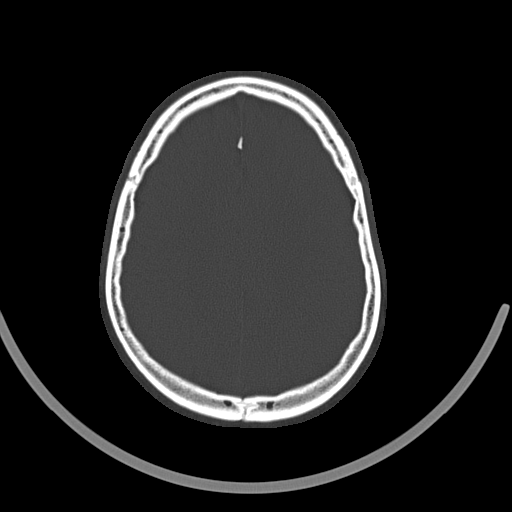
[im 53/67  bone]
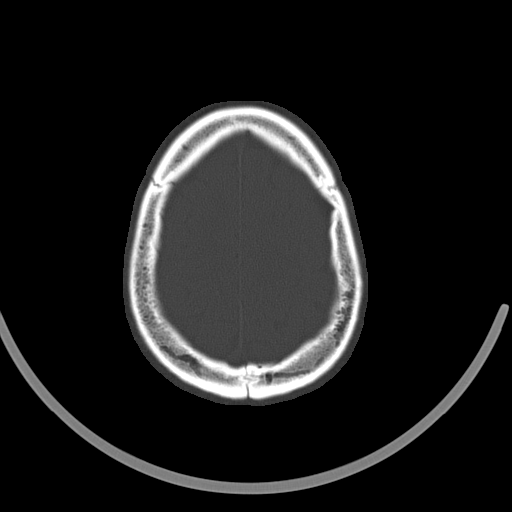
[im 60/67  bone]
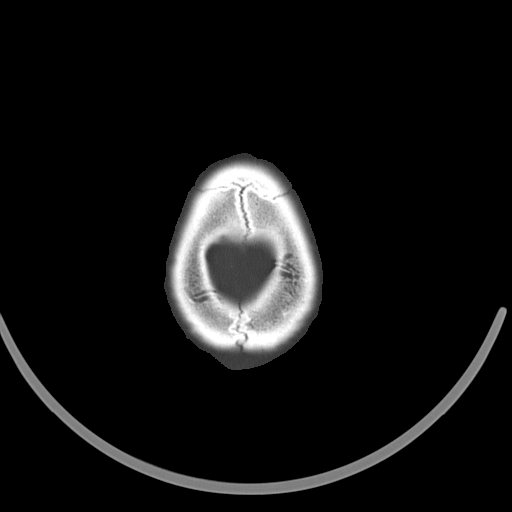

[16 of 30 positions shown; findings below may reference images not displayed]

FINDINGS: There is no acute intracranial hemorrhage or infarct. No mass lesion
or midline shift. Gray-white matter differentiation is well
maintained. Ventricles are normal in size without evidence of
hydrocephalus. CSF containing spaces are within normal limits. No
extra-axial fluid collection.

The calvarium is intact.

Orbital soft tissues are within normal limits.

Minimal polypoid opacity present within the right ethmoidal air
cells. Paranasal sinuses are otherwise clear. No mastoid effusion.

Scalp soft tissues are unremarkable.
IMPRESSION: No acute intracranial abnormality.
